# Patient Record
Sex: Male | Born: 1985 | Race: White | Hispanic: No | Marital: Single | Smoking: Never smoker
Health system: Southern US, Community
[De-identification: ages and names within clinical notes are randomized; demographics above are authoritative.]

## PROBLEM LIST (undated history)

## (undated) DIAGNOSIS — F419 Anxiety disorder, unspecified: Secondary | ICD-10-CM

## (undated) HISTORY — PX: ROSS KONNO PROCEDURE: SHX2364

## (undated) HISTORY — PX: AORTIC VALVE REPAIR: SHX6306

---

## 2007-02-07 ENCOUNTER — Ambulatory Visit (HOSPITAL_COMMUNITY): Admission: RE | Admit: 2007-02-07 | Discharge: 2007-02-07 | Payer: Self-pay | Admitting: Gastroenterology

## 2009-10-16 ENCOUNTER — Emergency Department (HOSPITAL_COMMUNITY): Admission: EM | Admit: 2009-10-16 | Discharge: 2009-10-16 | Payer: Self-pay | Admitting: Emergency Medicine

## 2010-07-27 ENCOUNTER — Emergency Department (HOSPITAL_COMMUNITY): Admission: EM | Admit: 2010-07-27 | Discharge: 2010-07-28 | Payer: Self-pay | Admitting: Emergency Medicine

## 2011-01-06 IMAGING — CR DG CHEST 2V
2 series · 2 of 2 positions shown · non-contrast
Comparison: None

CLINICAL DATA: Chest pain.  Panic attack.

CHEST - 2 VIEW

[w chest pa]
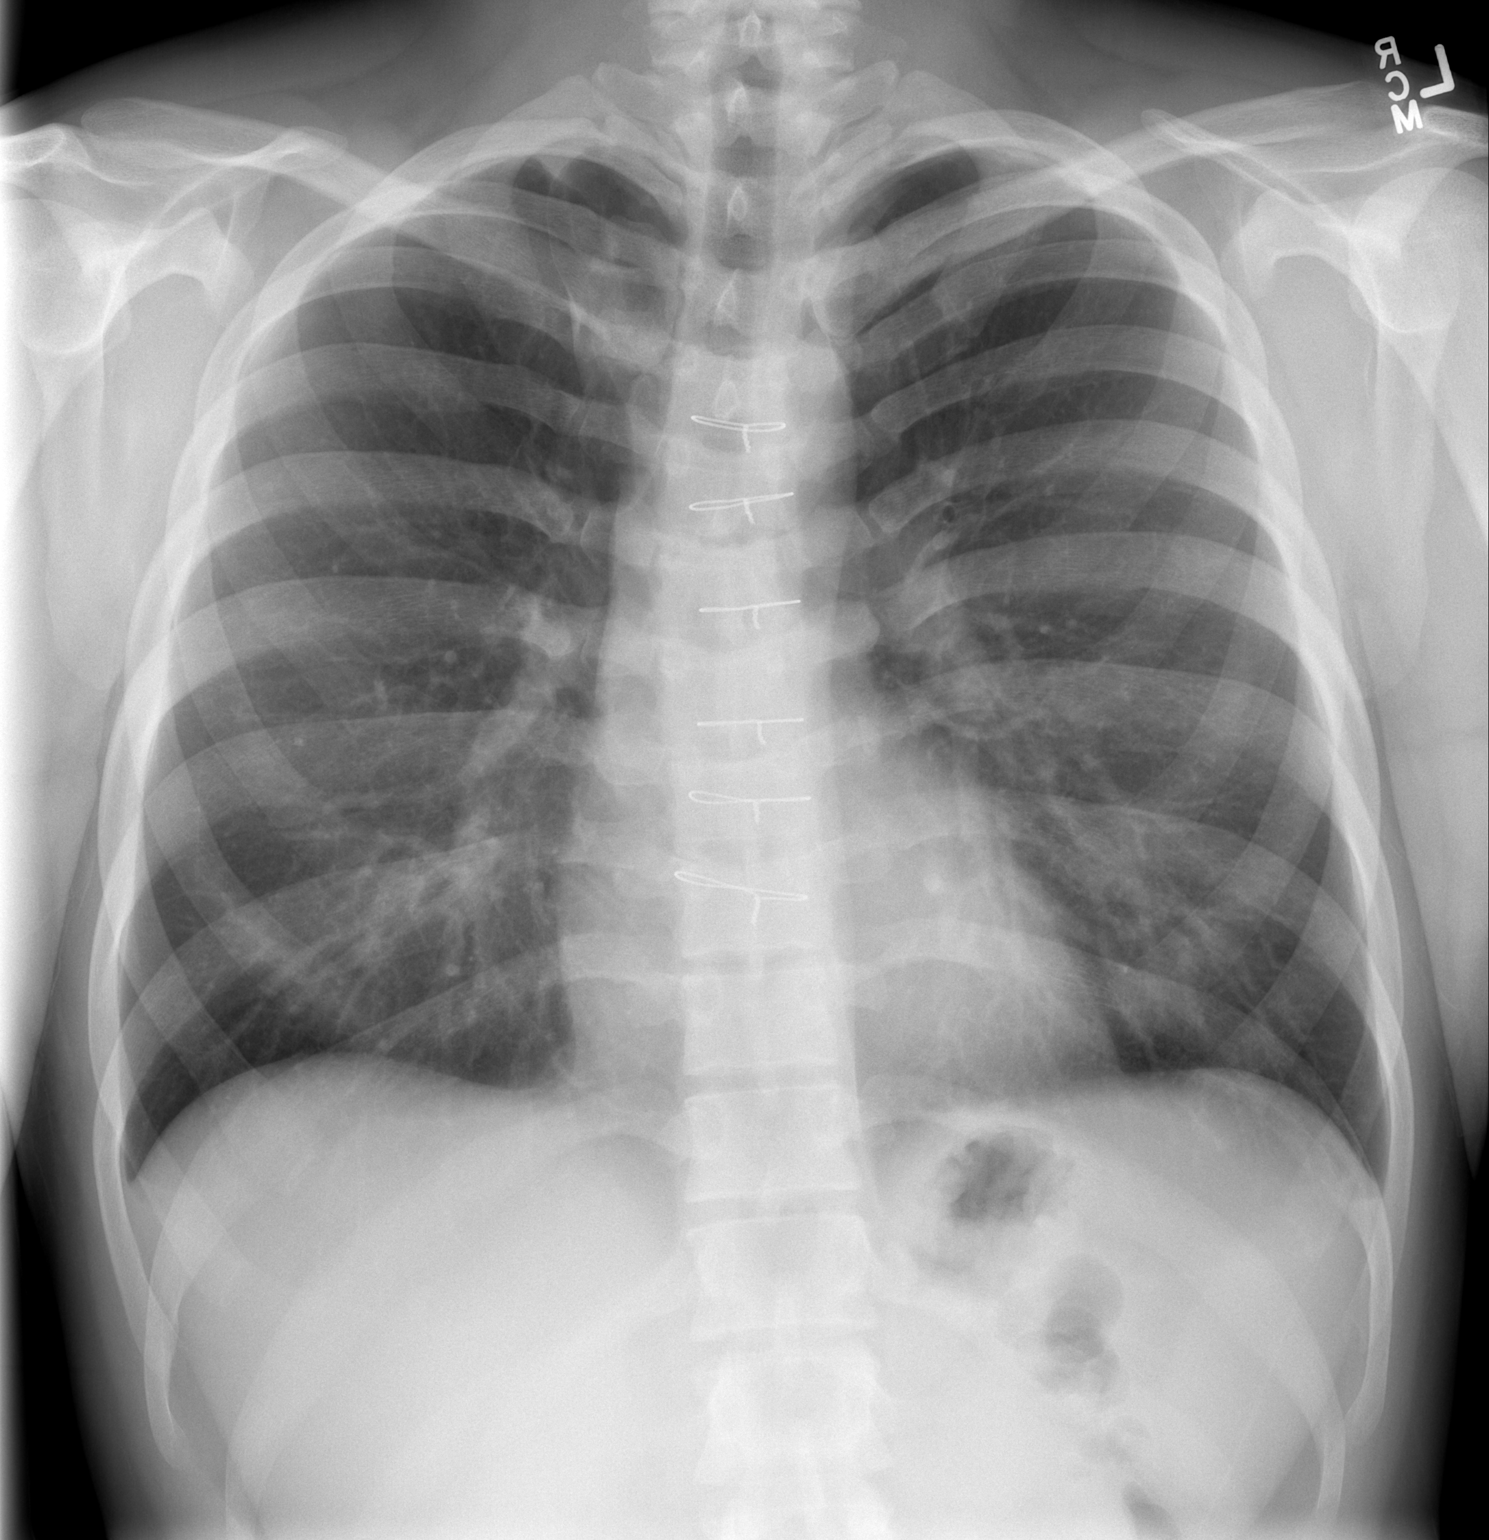

[w chest lat]
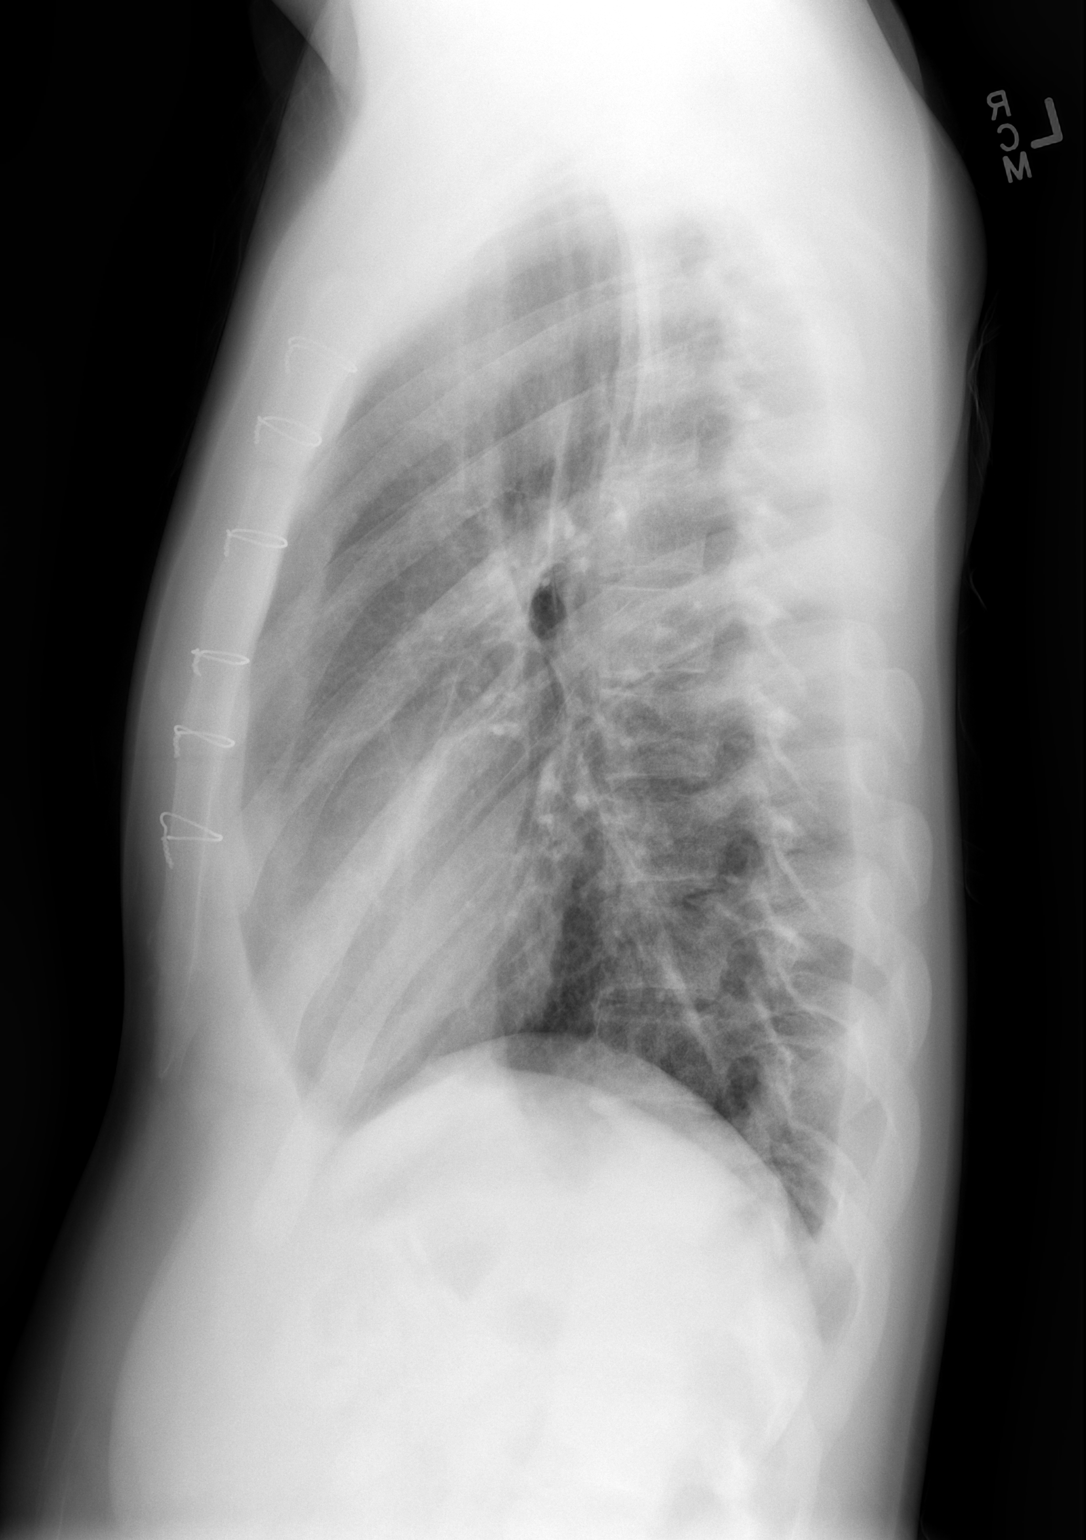

[2 of 2 positions shown; findings below may reference images not displayed]

FINDINGS: Prior surgical changes of median sternotomy wires.  The
cardiac silhouette, mediastinal and hilar contours are within
normal limits.  The lungs are clear.  An azygos lobe and fissure is
noted.  The bony thorax is intact.
IMPRESSION: No acute cardiopulmonary findings.

## 2014-10-28 ENCOUNTER — Telehealth: Payer: Self-pay

## 2014-10-28 NOTE — Telephone Encounter (Signed)
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,Aortic Root Repair,Aortic Dissection repair.Stated patient's grand father is a patient of Dr.Jordan's.Stated patient works there at [employer] but will be coming to stay with his mother in [place] to recover from his surgery.Stated she wanted to know if we could manage his coumadin while he is here.Stated he is being discharged on coumadin 5 mg daily and will need INR 10/29/14.Advised will check with our pharmacist Phillips HayKristin Alvstad.She advised to call her back on her cell phone # [phone].Pt's cell # [phone].Mother's home # [phone].    Spoke to Massachusetts Mutual LifeKristen Alvstad this morning she will manage coumadin while pt is in [place].INR appointment scheduled with her 10/30/14 at 9:30 Corena Pilgrimam.Karen Clark NP notified.She will fax a copy of patient's discharge summary and op note to our fax # [phone].Stated she gave pt a copy and he will bring to his appt.Patient was called and told of INR appt.10/30/14 at 9:30 am.

## 2014-10-30 ENCOUNTER — Telehealth: Payer: Self-pay

## 2014-10-30 ENCOUNTER — Emergency Department (HOSPITAL_COMMUNITY): Payer: BC Managed Care – PPO

## 2014-10-30 ENCOUNTER — Ambulatory Visit (INDEPENDENT_AMBULATORY_CARE_PROVIDER_SITE_OTHER): Payer: BC Managed Care – PPO | Admitting: Pharmacist Clinician (PhC)/ Clinical Pharmacy Specialist

## 2014-10-30 ENCOUNTER — Encounter: Payer: Self-pay | Admitting: Pharmacist Clinician (PhC)/ Clinical Pharmacy Specialist

## 2014-10-30 ENCOUNTER — Emergency Department (HOSPITAL_COMMUNITY)
Admission: EM | Admit: 2014-10-30 | Discharge: 2014-10-30 | Disposition: A | Discharge status: Home / Self Care | Payer: BC Managed Care – PPO | Attending: Emergency Medicine | Admitting: Emergency Medicine

## 2014-10-30 ENCOUNTER — Encounter (HOSPITAL_COMMUNITY): Payer: Self-pay | Admitting: *Deleted

## 2014-10-30 VITALS — BP 106/50 | HR 82

## 2014-10-30 DIAGNOSIS — Z7982 Long term (current) use of aspirin: Secondary | ICD-10-CM | POA: Diagnosis not present

## 2014-10-30 DIAGNOSIS — R5383 Other fatigue: Secondary | ICD-10-CM | POA: Insufficient documentation

## 2014-10-30 DIAGNOSIS — Z7901 Long term (current) use of anticoagulants: Secondary | ICD-10-CM | POA: Insufficient documentation

## 2014-10-30 DIAGNOSIS — F419 Anxiety disorder, unspecified: Secondary | ICD-10-CM | POA: Insufficient documentation

## 2014-10-30 DIAGNOSIS — Z952 Presence of prosthetic heart valve: Secondary | ICD-10-CM

## 2014-10-30 DIAGNOSIS — G8918 Other acute postprocedural pain: Secondary | ICD-10-CM | POA: Diagnosis not present

## 2014-10-30 DIAGNOSIS — R52 Pain, unspecified: Secondary | ICD-10-CM

## 2014-10-30 DIAGNOSIS — I351 Nonrheumatic aortic (valve) insufficiency: Secondary | ICD-10-CM

## 2014-10-30 DIAGNOSIS — Z9889 Other specified postprocedural states: Secondary | ICD-10-CM

## 2014-10-30 DIAGNOSIS — Z954 Presence of other heart-valve replacement: Secondary | ICD-10-CM

## 2014-10-30 DIAGNOSIS — R1110 Vomiting, unspecified: Secondary | ICD-10-CM | POA: Diagnosis present

## 2014-10-30 HISTORY — DX: Anxiety disorder, unspecified: F41.9

## 2014-10-30 LAB — CBC WITH DIFFERENTIAL/PLATELET
BASOS PCT: 1 % (ref 0–1)
Basophils Absolute: 0.1 10*3/uL (ref 0.0–0.1)
Eosinophils Absolute: 0.2 10*3/uL (ref 0.0–0.7)
Eosinophils Relative: 2 % (ref 0–5)
HEMATOCRIT: 31.6 % — AB (ref 39.0–52.0)
HEMOGLOBIN: 10.6 g/dL — AB (ref 13.0–17.0)
LYMPHS ABS: 1.3 10*3/uL (ref 0.7–4.0)
Lymphocytes Relative: 10 % — ABNORMAL LOW (ref 12–46)
MCH: 29.9 pg (ref 26.0–34.0)
MCHC: 33.5 g/dL (ref 30.0–36.0)
MCV: 89.3 fL (ref 78.0–100.0)
MONO ABS: 1 10*3/uL (ref 0.1–1.0)
MONOS PCT: 9 % (ref 3–12)
NEUTROS ABS: 9.6 10*3/uL — AB (ref 1.7–7.7)
Neutrophils Relative %: 78 % — ABNORMAL HIGH (ref 43–77)
Platelets: 503 10*3/uL — ABNORMAL HIGH (ref 150–400)
RBC: 3.54 MIL/uL — AB (ref 4.22–5.81)
RDW: 13.2 % (ref 11.5–15.5)
WBC: 12.2 10*3/uL — ABNORMAL HIGH (ref 4.0–10.5)

## 2014-10-30 LAB — COMPREHENSIVE METABOLIC PANEL
ALBUMIN: 3.4 g/dL — AB (ref 3.5–5.2)
ALK PHOS: 133 U/L — AB (ref 39–117)
ALT: 83 U/L — ABNORMAL HIGH (ref 0–53)
ANION GAP: 18 — AB (ref 5–15)
AST: 41 U/L — ABNORMAL HIGH (ref 0–37)
BILIRUBIN TOTAL: 0.6 mg/dL (ref 0.3–1.2)
BUN: 16 mg/dL (ref 6–23)
CHLORIDE: 99 meq/L (ref 96–112)
CO2: 23 mEq/L (ref 19–32)
CREATININE: 1.17 mg/dL (ref 0.50–1.35)
Calcium: 9.5 mg/dL (ref 8.4–10.5)
GFR, EST NON AFRICAN AMERICAN: 84 mL/min — AB (ref >90)
GLUCOSE: 121 mg/dL — AB (ref 70–99)
Potassium: 4.5 mEq/L (ref 3.7–5.3)
Sodium: 140 mEq/L (ref 137–147)
Total Protein: 7.8 g/dL (ref 6.0–8.3)

## 2014-10-30 LAB — URINALYSIS, ROUTINE W REFLEX MICROSCOPIC
Bilirubin Urine: NEGATIVE
Glucose, UA: NEGATIVE mg/dL
Hgb urine dipstick: NEGATIVE
Ketones, ur: NEGATIVE mg/dL
LEUKOCYTES UA: NEGATIVE
NITRITE: NEGATIVE
PH: 7.5 (ref 5.0–8.0)
Protein, ur: NEGATIVE mg/dL
SPECIFIC GRAVITY, URINE: 1.02 (ref 1.005–1.030)
Urobilinogen, UA: 1 mg/dL (ref 0.0–1.0)

## 2014-10-30 LAB — PROTIME-INR
INR: 2.37 — ABNORMAL HIGH (ref 0.00–1.49)
PROTHROMBIN TIME: 26.1 s — AB (ref 11.6–15.2)

## 2014-10-30 LAB — TROPONIN I

## 2014-10-30 LAB — POCT INR: INR: 3.6

## 2014-10-30 MED ORDER — ONDANSETRON 8 MG PO TBDP: 8.0000 mg | ORAL_TABLET | Freq: Three times a day (TID) | ORAL | Status: AC | PRN

## 2014-10-30 MED ORDER — SODIUM CHLORIDE 0.9 % IV BOLUS (SEPSIS)
500.0000 mL | Freq: Once | INTRAVENOUS | Status: AC
  Administered 2014-10-30: 500 mL via INTRAVENOUS

## 2014-10-30 MED ORDER — ONDANSETRON HCL 4 MG/2ML IJ SOLN
4.0000 mg | Freq: Once | INTRAMUSCULAR | Status: AC
  Administered 2014-10-30: 4 mg via INTRAVENOUS
  Filled 2014-10-30: qty 2

## 2014-10-30 NOTE — ED Notes (Signed)
EDP at bedside  

## 2014-10-30 NOTE — ED Notes (Signed)
[facility] @ [phone]

## 2014-10-30 NOTE — ED Notes (Signed)
MD at bedside. 

## 2014-10-30 NOTE — Progress Notes (Signed)
Patient had appointment for INR.Patient feeling bad.Patient was brought to a exam room.Patient stated he has been nauseated.Complains of vomiting off and on since this past Tuesday 10/27/14.Stated he has not been able to eat and drink.Ad[facility] to [facility].Spoke to ER triage nurse Ed Cliffton AstersWhite.EKG faxed to fax # [phone].

## 2014-10-30 NOTE — ED Notes (Signed)
Aortic valve replacement on 10/20/14; and having emesis, dizziness since d/c this past Tuesday. inr 3.3.

## 2014-10-30 NOTE — ED Notes (Signed)
Ambulated pt in the hall pt complained of sob and dizziness and lightheadedness no other complaints noted at this time

## 2014-10-30 NOTE — ED Notes (Signed)
Pt sitting on side of bed at this time. No dizziness reported. Pt states that he has some SOB but states "i just had heart surgery"

## 2014-10-30 NOTE — ED Provider Notes (Signed)
CSN: [id number]     Arrival date & time 10/30/14  1105 History   First MD Initiated Contact with Patient 10/30/14 1133     Chief Complaint  Patient presents with  . Emesis  . Dehydration  . Post-op Problem    Patient is a 28 y.o. male presenting with vomiting. The history is provided by the patient and a parent.  Emesis Severity:  Moderate Duration:  3 days Timing:  Intermittent Progression:  Worsening Chronicity:  New Relieved by:  None tried Exacerbated by: taking medications. Ineffective treatments:  None tried Associated symptoms: no abdominal pain, no diarrhea, no fever and no headaches   Risk factors comment:  Recent aortic valve surgery Patient presents for nausea/vomiting for 3 days He was discharged from [facility] s/p AVR.  He left hospital on 12/8.  On the ride home he vomited He reports he has vomited once/day while trying to take his medications Emesis is nonbloody No diarrhea No fever No new CP/SOB No syncope No abdominal pain is reported    Past Medical History  Diagnosis Date  . Anxiety    Past Surgical History  Procedure Laterality Date  . Aortic valve repair    . Ross konno procedure     History reviewed. No pertinent family history. History  Substance Use Topics  . Smoking status: Never Smoker   . Smokeless tobacco: Not on file  . Alcohol Use: No    Review of Systems  Constitutional: Positive for fatigue.  Respiratory: Negative for shortness of breath.   Cardiovascular: Negative for chest pain.  Gastrointestinal: Positive for vomiting. Negative for abdominal pain and diarrhea.  Neurological: Negative for syncope and headaches.  All other systems reviewed and are negative.     Allergies  Review of patient's allergies indicates no known allergies.  Home Medications   Prior to Admission medications   Medication Sig Start Date End Date Taking? Authorizing Provider  ALPRAZolam Prudy Feeler) 1 MG tablet Take 1 mg by mouth 2 (two) times daily as  needed for anxiety.  10/28/14  Yes Historical Provider, MD  aspirin EC 81 MG tablet Take 81 mg by mouth daily.   Yes Historical Provider, MD  carvedilol (COREG) 3.125 MG tablet Take 3.125 mg by mouth 2 (two) times daily with a meal.  10/28/14  Yes Historical Provider, MD  famotidine (PEPCID AC) 10 MG chewable tablet Chew 10 mg by mouth 2 (two) times daily as needed for heartburn.   Yes Historical Provider, MD  furosemide (LASIX) 20 MG tablet Take 20 mg by mouth daily.  10/28/14  Yes Historical Provider, MD  OVER THE COUNTER MEDICATION Take 1 tablet by mouth daily. Stool softener   Yes Historical Provider, MD  potassium chloride (K-DUR) 10 MEQ tablet Take 10 mEq by mouth daily.  10/28/14  Yes Historical Provider, MD  traMADol (ULTRAM) 50 MG tablet Take 50 mg by mouth every 6 (six) hours as needed for moderate pain.  10/28/14  Yes Historical Provider, MD  warfarin (COUMADIN) 5 MG tablet Take 5 mg by mouth daily.  10/28/14  Yes Historical Provider, MD  oxyCODONE (OXY IR/ROXICODONE) 5 MG immediate release tablet Take 5 mg by mouth every 6 (six) hours as needed for moderate pain.  10/28/14   Historical Provider, MD   BP 121/61 mmHg  Pulse 65  Temp(Src) 98 F (36.7 C) (Oral)  Ht 5\' 8"  (1.727 m)  Wt 200 lb (90.719 kg)  BMI 30.42 kg/m2  SpO2 95% Physical Exam CONSTITUTIONAL: Well developed/well nourished  HEAD: Normocephalic/atraumatic EYES: EOMI/PERRL ENMT: Mucous membranes dry NECK: supple no meningeal signs SPINE/BACK:entire spine nontender CV: mechanical click noted LUNGS: Lungs are clear to auscultation bilaterally, no apparent distress Chest - midline incision is healing well without erythema/tenderness/discharge ABDOMEN: soft, nontender, no rebound or guarding, bowel sounds noted throughout abdomen Surgical wounds to upper abdomen healing well without erythema/discharge GU:no cva tenderness NEURO: Pt is awake/alert/appropriate, moves all extremitiesx4.  No facial droop.   EXTREMITIES: pulses  normal/equal x4, full ROM, no LE edema SKIN: warm, color normal PSYCH: no abnormalities of mood noted, alert and oriented to situation  ED Course  Procedures  12:11 PM Pt seen for vomiting s/p AVR Per d/c summary from [facility], pt with h/o bicuspid aortic valve and also aneurysmal dilatation of aorta.  He underwent repair on 10/20/14.  His postop course was unremarkable Labs/imaging ordered Pt is currently awake/alert/stable Labs from [facility] - WBC at 8.2 on 12/8 2:07 PM D/w dr Mayford Knifewilliams, patient's surgeon We reviewed imaging/labs/ekg INR at 2.3 is appropriate for him This could be pain medication related causing his nausea He does not suggest any other acute testing/imaging at this time   Pt reports symptoms while ambulating are his baseline He feels well/improved and would like to go home We discussed strict return precautions He will stop tramadol and start APAP (possible medication induced nausea) Will give zofran in case nausea worsens but I encouraged him to avoid if possible and stop tramadol first BP 100/58 mmHg  Pulse 97  Temp(Src) 98.8 F (37.1 C) (Oral)  Resp 13  Ht 5\' 8"  (1.727 m)  Wt 200 lb (90.719 kg)  BMI 30.42 kg/m2  SpO2 97%  Labs Review Labs Reviewed  CBC WITH DIFFERENTIAL - Abnormal; Notable for the following:    WBC 12.2 (*)    RBC 3.54 (*)    Hemoglobin 10.6 (*)    HCT 31.6 (*)    Platelets 503 (*)    Neutrophils Relative % 78 (*)    Neutro Abs 9.6 (*)    Lymphocytes Relative 10 (*)    All other components within normal limits  COMPREHENSIVE METABOLIC PANEL - Abnormal; Notable for the following:    Glucose, Bld 121 (*)    Albumin 3.4 (*)    AST 41 (*)    ALT 83 (*)    Alkaline Phosphatase 133 (*)    GFR calc non Af Amer 84 (*)    Anion gap 18 (*)    All other components within normal limits  PROTIME-INR - Abnormal; Notable for the following:    Prothrombin Time 26.1 (*)    INR 2.37 (*)    All other components within normal limits  URINALYSIS,  ROUTINE W REFLEX MICROSCOPIC  TROPONIN I    Imaging Review Dg Chest Portable 1 View  10/30/2014   CLINICAL DATA:  Vomiting.  EXAM: PORTABLE CHEST - 1 VIEW  COMPARISON:  October 16, 2009.  FINDINGS: The heart size and mediastinal contours are within normal limits. Status post aortic valve repair. No pneumothorax or pleural effusion is noted. Both lungs are clear. The visualized skeletal structures are unremarkable.  IMPRESSION: No acute cardiopulmonary abnormality seen.   Electronically Signed   By: Roque LiasJames  Green M.D.   On: 10/30/2014 12:42     Date: 10/30/2014 11:14am  Rate: 71  Rhythm: normal sinus rhythm  QRS Axis: normal  Intervals: normal  ST/T Wave abnormalities: ST depressions anteriorly  Conduction Disutrbances:right bundle branch block  Narrative Interpretation: PVC noted  Old EKG  Reviewed: unchanged from EKG from cardiology office  Medications  ondansetron (ZOFRAN) injection 4 mg (4 mg Intravenous Given 10/30/14 1153)  sodium chloride 0.9 % bolus 500 mL (0 mLs Intravenous Stopped 10/30/14 1411)     MDM   Final diagnoses:  Pain  Post-operative state    Nursing notes including past medical history and social history reviewed and considered in documentation Labs/vital reviewed myself and considered during evaluation xrays/imaging reviewed by myself and considered during evaluation Previous records reviewed and considered     Joya Gaskinsonald W Jaheem Hedgepath, MD 10/30/14 1552

## 2014-10-30 NOTE — Telephone Encounter (Signed)
Opened in error

## 2014-10-30 NOTE — Discharge Instructions (Signed)

## 2017-08-24 ENCOUNTER — Encounter (HOSPITAL_COMMUNITY): Payer: Self-pay | Admitting: *Deleted
